# Patient Record
Sex: Male | Born: 1935 | Race: White | Hispanic: No | Smoking: Never smoker
Health system: Southern US, Community
[De-identification: ages and names within clinical notes are randomized; demographics above are authoritative.]

## PROBLEM LIST (undated history)

## (undated) DIAGNOSIS — F0390 Unspecified dementia without behavioral disturbance: Secondary | ICD-10-CM

## (undated) DIAGNOSIS — C61 Malignant neoplasm of prostate: Secondary | ICD-10-CM

## (undated) DIAGNOSIS — F32A Depression, unspecified: Secondary | ICD-10-CM

## (undated) DIAGNOSIS — E039 Hypothyroidism, unspecified: Secondary | ICD-10-CM

## (undated) DIAGNOSIS — D649 Anemia, unspecified: Secondary | ICD-10-CM

## (undated) DIAGNOSIS — G4700 Insomnia, unspecified: Secondary | ICD-10-CM

## (undated) DIAGNOSIS — I10 Essential (primary) hypertension: Secondary | ICD-10-CM

## (undated) DIAGNOSIS — K219 Gastro-esophageal reflux disease without esophagitis: Secondary | ICD-10-CM

## (undated) DIAGNOSIS — F329 Major depressive disorder, single episode, unspecified: Secondary | ICD-10-CM

## (undated) DIAGNOSIS — E871 Hypo-osmolality and hyponatremia: Secondary | ICD-10-CM

## (undated) HISTORY — DX: Hypo-osmolality and hyponatremia: E87.1

## (undated) HISTORY — DX: Depression, unspecified: F32.A

## (undated) HISTORY — DX: Major depressive disorder, single episode, unspecified: F32.9

## (undated) HISTORY — DX: Insomnia, unspecified: G47.00

## (undated) HISTORY — DX: Malignant neoplasm of prostate: C61

## (undated) HISTORY — PX: OTHER SURGICAL HISTORY: SHX169

## (undated) HISTORY — DX: Hypothyroidism, unspecified: E03.9

---

## 2000-05-30 ENCOUNTER — Ambulatory Visit (HOSPITAL_COMMUNITY): Admission: RE | Admit: 2000-05-30 | Discharge: 2000-05-30 | Payer: Self-pay | Admitting: Internal Medicine

## 2002-12-07 ENCOUNTER — Ambulatory Visit (HOSPITAL_COMMUNITY): Admission: RE | Admit: 2002-12-07 | Discharge: 2002-12-07 | Payer: Self-pay | Admitting: Gastroenterology

## 2005-01-22 ENCOUNTER — Encounter: Admission: RE | Admit: 2005-01-22 | Discharge: 2005-01-22 | Payer: Self-pay | Admitting: Vascular Surgery

## 2005-11-06 ENCOUNTER — Ambulatory Visit (HOSPITAL_COMMUNITY): Admission: RE | Admit: 2005-11-06 | Discharge: 2005-11-06 | Payer: Self-pay | Admitting: Internal Medicine

## 2006-08-12 ENCOUNTER — Encounter: Admission: RE | Admit: 2006-08-12 | Discharge: 2006-08-12 | Payer: Self-pay | Admitting: Internal Medicine

## 2007-05-18 ENCOUNTER — Encounter: Admission: RE | Admit: 2007-05-18 | Discharge: 2007-05-18 | Payer: Self-pay | Admitting: Internal Medicine

## 2007-10-15 ENCOUNTER — Encounter: Admission: RE | Admit: 2007-10-15 | Discharge: 2007-10-15 | Payer: Self-pay | Admitting: Gastroenterology

## 2008-01-01 ENCOUNTER — Other Ambulatory Visit: Payer: Self-pay

## 2008-01-01 ENCOUNTER — Emergency Department: Payer: Self-pay | Admitting: Emergency Medicine

## 2008-04-18 ENCOUNTER — Ambulatory Visit: Payer: Self-pay | Admitting: Psychology

## 2010-05-20 ENCOUNTER — Encounter: Payer: Self-pay | Admitting: Internal Medicine

## 2012-11-04 ENCOUNTER — Other Ambulatory Visit: Payer: Self-pay

## 2012-11-04 MED ORDER — CLONAZEPAM 0.5 MG PO TABS: 0.7500 mg | ORAL_TABLET | Freq: Every evening | ORAL | Status: AC | PRN

## 2013-02-01 ENCOUNTER — Ambulatory Visit: Payer: Self-pay | Admitting: Gastroenterology

## 2013-03-12 LAB — COMPREHENSIVE METABOLIC PANEL
Albumin: 4 g/dL (ref 3.4–5.0)
BUN: 21 mg/dL — ABNORMAL HIGH (ref 7–18)
Bilirubin,Total: 0.7 mg/dL (ref 0.2–1.0)
Chloride: 101 mmol/L (ref 98–107)
Co2: 27 mmol/L (ref 21–32)
Creatinine: 1 mg/dL (ref 0.60–1.30)

## 2013-03-12 LAB — URINALYSIS, COMPLETE
Bacteria: NONE SEEN
Bilirubin,UR: NEGATIVE
Hyaline Cast: 7
Leukocyte Esterase: NEGATIVE
Nitrite: NEGATIVE
Protein: NEGATIVE
RBC,UR: 1 /HPF (ref 0–5)
Specific Gravity: 1.014 (ref 1.003–1.030)
Squamous Epithelial: <1
WBC UR: 1 /HPF (ref 0–5)

## 2013-03-12 LAB — CBC
HCT: 37.7 % — ABNORMAL LOW (ref 40.0–52.0)
MCHC: 34.5 g/dL (ref 32.0–36.0)
RBC: 3.83 10*6/uL — ABNORMAL LOW (ref 4.40–5.90)

## 2013-03-12 LAB — SEDIMENTATION RATE: Erythrocyte Sed Rate: 1 mm/hr (ref 0–20)

## 2013-03-12 LAB — TSH
Thyroid Stimulating Horm: 0.456 u[IU]/mL
Thyroid Stimulating Horm: 0.346 u[IU]/mL — ABNORMAL LOW

## 2013-03-13 LAB — CBC WITH DIFFERENTIAL/PLATELET
Basophil #: 0.1 10*3/uL (ref 0.0–0.1)
Lymphocyte #: 0.8 10*3/uL — ABNORMAL LOW (ref 1.0–3.6)
MCH: 34.6 pg — ABNORMAL HIGH (ref 26.0–34.0)
MCHC: 35.1 g/dL (ref 32.0–36.0)
Monocyte #: 0.5 x10 3/mm (ref 0.2–1.0)
Neutrophil #: 3.2 10*3/uL (ref 1.4–6.5)
RBC: 3.36 10*6/uL — ABNORMAL LOW (ref 4.40–5.90)
RDW: 12.5 % (ref 11.5–14.5)

## 2013-03-13 LAB — BASIC METABOLIC PANEL
Anion Gap: 3 — ABNORMAL LOW (ref 7–16)
Co2: 27 mmol/L (ref 21–32)
EGFR (African American): >60
EGFR (Non-African Amer.): >60
Sodium: 136 mmol/L (ref 136–145)

## 2013-03-13 LAB — LIPID PANEL
Cholesterol: 108 mg/dL (ref 0–200)
HDL Cholesterol: 41 mg/dL (ref 40–60)

## 2013-03-13 LAB — MAGNESIUM: Magnesium: 1.9 mg/dL

## 2013-03-13 LAB — WBCS, STOOL

## 2013-03-15 ENCOUNTER — Inpatient Hospital Stay: Payer: Self-pay | Admitting: Internal Medicine

## 2013-03-15 LAB — STOOL CULTURE

## 2013-03-16 LAB — URINALYSIS, COMPLETE: Squamous Epithelial: NONE SEEN

## 2013-03-28 ENCOUNTER — Emergency Department: Payer: Self-pay | Admitting: Internal Medicine

## 2013-03-28 LAB — URINALYSIS, COMPLETE
Bilirubin,UR: NEGATIVE
Leukocyte Esterase: NEGATIVE
Ph: 6 (ref 4.5–8.0)
Squamous Epithelial: NONE SEEN
WBC UR: 1 /HPF (ref 0–5)

## 2013-03-28 LAB — COMPREHENSIVE METABOLIC PANEL
Bilirubin,Total: 0.6 mg/dL (ref 0.2–1.0)
Chloride: 95 mmol/L — ABNORMAL LOW (ref 98–107)
EGFR (Non-African Amer.): >60
Potassium: 4.2 mmol/L (ref 3.5–5.1)
SGPT (ALT): 24 U/L (ref 12–78)
Sodium: 131 mmol/L — ABNORMAL LOW (ref 136–145)

## 2013-03-28 LAB — CBC
HGB: 13.4 g/dL (ref 13.0–18.0)
MCH: 34 pg (ref 26.0–34.0)
MCHC: 34.6 g/dL (ref 32.0–36.0)
RDW: 12.4 % (ref 11.5–14.5)

## 2013-03-28 LAB — TSH: Thyroid Stimulating Horm: 2.29 u[IU]/mL

## 2013-03-28 LAB — TROPONIN I: Troponin-I: <0.02 ng/mL

## 2013-03-29 ENCOUNTER — Other Ambulatory Visit: Payer: Self-pay | Admitting: Internal Medicine

## 2013-03-31 ENCOUNTER — Other Ambulatory Visit: Payer: Self-pay | Admitting: Radiology

## 2013-04-06 ENCOUNTER — Ambulatory Visit: Payer: Self-pay | Admitting: Oncology

## 2013-04-06 LAB — CBC CANCER CENTER
Basophil #: 0 x10 3/mm (ref 0.0–0.1)
Eosinophil #: 0.1 x10 3/mm (ref 0.0–0.7)
Eosinophil %: 1.6 %
HGB: 12.3 g/dL — ABNORMAL LOW (ref 13.0–18.0)
MCH: 33.5 pg (ref 26.0–34.0)
MCV: 99 fL (ref 80–100)
Monocyte #: 0.6 x10 3/mm (ref 0.2–1.0)
Neutrophil #: 5.1 x10 3/mm (ref 1.4–6.5)
WBC: 7 x10 3/mm (ref 3.8–10.6)

## 2013-04-06 LAB — COMPREHENSIVE METABOLIC PANEL
Bilirubin,Total: 0.3 mg/dL (ref 0.2–1.0)
EGFR (African American): 53 — ABNORMAL LOW
EGFR (Non-African Amer.): 46 — ABNORMAL LOW
Potassium: 4.5 mmol/L (ref 3.5–5.1)
SGOT(AST): 20 U/L (ref 15–37)
Sodium: 137 mmol/L (ref 136–145)

## 2013-04-14 ENCOUNTER — Ambulatory Visit: Payer: Self-pay | Admitting: Neurology

## 2013-04-14 ENCOUNTER — Other Ambulatory Visit: Payer: Self-pay

## 2013-04-29 ENCOUNTER — Ambulatory Visit: Payer: Self-pay | Admitting: Oncology

## 2013-05-25 ENCOUNTER — Ambulatory Visit: Payer: Self-pay | Admitting: Gastroenterology

## 2013-05-27 LAB — PATHOLOGY REPORT

## 2013-07-09 ENCOUNTER — Ambulatory Visit: Payer: Self-pay | Admitting: Oncology

## 2013-09-27 ENCOUNTER — Ambulatory Visit: Payer: Self-pay | Admitting: Gastroenterology

## 2014-07-15 ENCOUNTER — Ambulatory Visit: Payer: Self-pay | Admitting: Gastroenterology

## 2014-08-19 NOTE — Consult Note (Signed)
Brief Consult Note: Diagnosis: Major depressive disorder.   Patient was seen by consultant.   Consult note dictated.   Recommend further assessment or treatment.   Comments: Clayton Ballard has a h/o depresssion. He has been of Remeron for the past 6 years. He is admitted for diarrhea, nausea and weight loss. He was seen by Dr. Franchot Mimes in consultation this weeekend. She recommended starteing low dose Zyprexa but she did not pou the order in.   The patient minimizes his symptoms today and is not interested in starting a new medication.  PLAN: 1. The patient does not meet criteria for IVC. Please discharge as appropriate.   2. I suggested he could try Remeron again for depression, anxiety, sleep and appetite. He prefers to discuss it with his PCP.  Electronic Signatures: Orson Slick (MD)  (Signed 17-Nov-14 17:13)  Authored: Brief Consult Note   Last Updated: 17-Nov-14 17:13 by Orson Slick (MD)

## 2014-08-19 NOTE — Consult Note (Signed)
PATIENT NAME:  Clayton Ballard, Clayton Ballard MR#:  [id number] DATE OF BIRTH:  [birth year]  DATE OF CONSULTATION:  03/12/2013  REFERRING PHYSICIAN:   CONSULTING PHYSICIAN:  Dionisio David, MD  INDICATION FOR CONSULTATION:  Sinus bradycardia.   HISTORY OF PRESENT ILLNESS:  This is a 80 year old white male with the past medical history of hypothyroidism, insomnia, came into the hospital with generalized weakness, dizziness, disturbance in gait for the past couple of weeks. No syncope, no chest pain. No shortness of breath. No PND, no orthopnea. He basically feels very dizzy when he walks. He has been getting physical therapy with some improvement.   PAST MEDICAL HISTORY:  1.  A history of hypothyroidism. 2.  A history of depression.  3.  A history of insomnia.   MEDICATIONS:  1.  Diazepam. 2.  Zofran 3.  Bupropion 7.5 mg once a day. He has not been taking any beta-blocker.   SOCIAL HISTORY:  Denies EtOH abuse or smoking.   FAMILY HISTORY:  Positive for coronary artery disease.   ALLERGIES: None.   PHYSICAL EXAMINATION:  GENERAL:  Heart rate is 44, the monitor shows right now 46 heart rate, respirations 18, blood pressure is 123/62. He is afebrile.  NECK:  No JVD.  LUNGS:  Clear.  HEART:  Regular rate and rhythm, bradycardiac, normal S1, S2. A 2/6 systolic murmur and diastolic murmur at the aortic area.  ABDOMEN:  Soft, nontender, positive bowel sounds.  EXTREMITIES:  No pedal edema.  NEUROLOGIC:  Appears to be intact.   LABS/STUDIES:  EKG shows sinus bradycardia, 46 beats per minute, left axis deviation, pulmonary disease, low voltage, old septal and MI. Echocardiogram shows normal LV function, moderate to severe tricuspid regurgitation, moderate to severe aortic regurgitation, dilated RA and RV and left atrium.   ASSESSMENT AND PLAN:  The patient has aortic regurgitation, severe sinus bradycardia. No positives on the monitor, the lowest heart rate was 44. We will observe the patient by giving IV  fluids and watching the patient on the monitor, may have sick sinus syndrome. If there are positives and continues to be dizzy with this sinus bradycardia, then this would be considered symptomatic sinus bradycardia and may need permanent pacemaker but right now we would like to watch this patient over the monitor in the next day or two to decide whether he is candidate for pacemaker. Thank you very much for the referral.   ____________________________ Dionisio David, MD sak:jm D: 03/12/2013 17:03:45 ET T: 03/12/2013 17:27:17 ET JOB#: [id number]  cc: Dionisio David, MD, <Dictator> Dionisio David MD ELECTRONICALLY SIGNED 04/05/2013 8:32

## 2014-08-19 NOTE — Discharge Summary (Signed)
PATIENT NAME:  Clayton Ballard, Clayton Ballard MR#:  [id number] DATE OF BIRTH:  March 05, 1935  DATE OF ADMISSION:  03/15/2013 DATE OF DISCHARGE:  03/16/2013  ADMITTING PHYSICIAN: Shreyang H. Posey Pronto, MD  DISCHARGING PHYSICIAN: Gladstone Lighter, MD   PRIMARY CARE PHYSICIAN: Richard L. Rosanna Randy, Britt:  1.  Cardiology consultation with Dionisio David, MD. 2.  Psych consultation by Wardell Honour. Bary Leriche, MD.  DISCHARGE DIAGNOSES: 1.  Asymptomatic sinus bradycardia, heart rate in the 50 range.  2.  Severe aortic regurgitation.  3.  Hypothyroidism.  4.  Hypertension.  5.  Depression and anxiety.  6.  Benign prostatic hypertrophy.  7.  Incontinence.  8.  Fatigue.  9.  Dementia with cognitive impairment.   DISCHARGE MEDICATIONS: 1.  Zofran 8 mg p.o. b.i.d. p.r.n. for nausea and vomiting.  2.  Buspirone 7.5 mg p.o. daily.  3.  VESIcare 5 mg p.o. daily.  4.  Finasteride 5 mg p.o. daily.  5.  Diazepam 2 mg p.o. daily.  6.  Norvasc 2.5 mg p.o. daily.   DISCHARGE DIET: Low-sodium diet.   DISCHARGE ACTIVITY: As tolerated.    FOLLOWUP INSTRUCTIONS: 1.  Home health physical therapy and nursing.  2.  PCP followup in 1 to 2 weeks.  3.  Follow up with Dr. Neoma Laming in 1 week. Scheduled his followup for 03/22/2013 at 10:00 a.m. with Dr. Humphrey Rolls.   LABORATORY AND IMAGING STUDIES PRIOR TO DISCHARGE:  1.  Urinalysis negative for any infection.  2.  WBC 4.7, hemoglobin 11.6, hematocrit 33.1, platelet count is 130.  3.  Sodium 136, potassium 4.3, chloride 106, bicarb 27, BUN 12, creatinine 0.99, glucose 79, calcium of 8.3, magnesium 1.9.  4.  Stool cultures are negative.  5.  LDL cholesterol 55, HDL 41, total cholesterol 108, triglycerides 59.  6.  CT of the abdomen and pelvis done on admission showing no acute intraabdominal or pelvic pathology, moderate amount of stool within colon noted, horseshoe kidney with nonobstructing right renal calculus 6 mm noted.  7.  Echo Doppler  showing LV ejection fraction is 70% to 75%, impaired relaxation of LV diastolic filling and severely enlarged right ventricle and moderate to severe aortic regurgitation is seen.   8.  Serum testosterone level is within normal limits.  9.  Vitamin D level is 86.9.  10.  Serum cortisol is within normal limits at 18.2.  11.  Free thyroxine is elevated at 1.6.   BRIEF HOSPITAL COURSE: Clayton Ballard a 80 year old elderly Caucasian male with past medical history significant for dementia with cognitive impairment, hypertension, hypothyroidism and depression with anxiety who lives at home with his wife, was brought in secondary to weakness, nausea and diarrhea.  1.  Generalized weakness and fatigue when he came in, likely secondary to dehydration as the patient was having gastroenteritis which could be likely viral.  His CT of the abdomen was completely normal. He was started on IV fluids. Blood pressure was initially low, that improved with fluids. The patient had a previous colonoscopy according to family which was normal at the time. However, his GI symptoms resolved and his weakness and fatigue have also been improved.  2.  Sinus bradycardia. He was noted to be sinus bradycardic, heart rate as low as in the 40s while in the hospital. The patient is known to have sinus bradycardia. He was monitored on telemetry for a couple of days to make sure there were no other blocks and heart rate did not go down further.  His initial symptoms of fatigue and weakness were likely secondary to diarrhea and not secondary to his bradycardia. Once his GI symptoms improved, the patient does not have any further fatigue or weakness and was back to baseline. He was seen by Dr. Humphrey Rolls from cardiology standpoint and will follow up with cardiology as an outpatient in 1 week to see if he needs an event monitor. No indication for pacemaker at this time. Beta blockers and calcium channel blockers are being avoided at this time.  3.   Hypertension. Orthostatic blood pressure changes were measured and the patient did not have any hypotension and he is being started on low-dose amlodipine. Amlodipine will not cause any bradycardia.  4.  Depression and anxiety, seen by psych while in the hospital. He is on bupropion and also diazepam. Psych has recommended Remeron but the patient and the family have refused at this time and wants to follow up with the PCP prior to being started on that medication.   His course has been otherwise uneventful in the hospital. He was seen by physical therapy, who recommended home health prior to discharge.   DISCHARGE CONDITION: Stable.   DISCHARGE DISPOSITION: Home with home health.   TIME SPENT ON DISCHARGE: 45 minutes.   ____________________________ Gladstone Lighter, MD rk:cs D: 03/16/2013 14:16:00 ET T: 03/16/2013 15:26:19 ET JOB#: [id number]  cc: Gladstone Lighter, MD, <Dictator> Richard L. Rosanna Randy, MD Dionisio David, MD Gladstone Lighter MD ELECTRONICALLY SIGNED 03/23/2013 18:16

## 2014-08-19 NOTE — H&P (Signed)
PATIENT NAME:  Clayton Ballard, Clayton Ballard MR#:  [id number] DATE OF BIRTH:  [birth year]  DATE OF ADMISSION:  03/12/2013  PRIMARY CARE PROVIDER: Dr. Ayesha Rumpf REFERRING PHYSICIAN:  Dr. Corky Downs  CHIEF COMPLAINT: Generalized weakness, nausea, diarrhea, weight loss.   HISTORY OF PRESENT ILLNESS: The patient is a 80 year old white male with history of hypothyroidism and insomnia who also has a history of depression who states that he has not been feeling well for the past few weeks. The patient has had progressive weakness and generalized weakness. He also has been feeling very dizzy and has fallen multiple times. He also states that he almost felt like he was going to pass out. The patient also reports that over the past few weeks he has been having diarrhea which he describes as semisolid in nature associated with nausea. He has not noticed any blood in the stools. He has also no vomiting but has had nausea. He does not have any abdominal pain. He reports that he has had a colonoscopy within the past few years here, but I do not see any results of that. He also has been trying to eat much as possible, but the nausea limits his eating. He denies any chest pains. No shortness of breath. No palpitations. Denies any urinary frequency, urgency or hesitancy.   PAST MEDICAL HISTORY: He is listed as having hypothyroidism, but the patient not on any supplements. He is not sure if he has hypothyroidism. Significant for insomnia, depression and having low heart rate in the past, but reports that this is much lower than normal.   PAST SURGICAL HISTORY: None.   ALLERGIES: No known drug allergies.   CURRENT MEDICATIONS: Bupropion 7.5 mg p.o. daily, diazepam 2 mg daily, finasteride 5 mg daily, Zofran 8 mg 1 tab p.o. b.i.d. as needed and VESIcare 5 mg daily.   SOCIAL HISTORY: Does not smoke. Does not drink. No drugs.   FAMILY HISTORY: He states that he is unable to recall.   REVIEW OF SYSTEMS:  CONSTITUTIONAL: Complains of  fatigue and weakness. No pain. Complains of weight loss, he says 5 to 10 pounds within the past few months.  EYES: No blurred or double vision. No pain. No redness. No inflammation. No glaucoma. No cataracts.  ENT: No tinnitus. No ear pain. No hearing loss. No seasonal or year-round allergies. No epistaxis. No discharge. No snoring.  RESPIRATORY: Denies any cough, wheezing, hemoptysis. No COPD. No TB  CARDIOVASCULAR: Denies any chest pain, orthopnea, edema or arrhythmia. No palpitations.  GASTROINTESTINAL: Complains of nausea, but no vomiting. Complains of diarrhea. No abdominal pain. No hematemesis. No melena. No GERD. No IBS. No jaundice.  GENITOURINARY: Denies any dysuria, hematuria, renal calculus or frequency.  ENDOCRINE: Denies any polyuria, nocturia or thyroid problems.  HEMATOLOGIC AND LYMPHATIC: Denies any major bruisability or bleeding.  SKIN: No acne. No rash. No changes in mole, hair or skin.  MUSCULOSKELETAL: Denies any pain in the neck, back or shoulder.  NEUROLOGIC: No numbness. No CVA. No TIA. No seizures.  PSYCHIATRIC: Has depression.   PHYSICAL EXAMINATION: VITAL SIGNS: Temperature 97.6, pulse 47, respirations 18, blood pressure 141/53, O2 99%.  GENERAL: The patient is a very frail-looking male, thin, in no acute distress.  HEENT: Head atraumatic, normocephalic. Pupils equally round and reactive to light and accommodation. There is no conjunctival pallor. No scleral icterus. Extraocular movements intact. Nasal exam shows no nasal lesions or drainage. Ears: There is no drainage or external lesions. Mouth: Lips are dry but mouth is moist.  NECK:  Supple and symmetric. No masses. Thyroid midline.  LUNGS: Good respiratory effort. Clear to auscultation without any rales, rhonchi or wheezing.  HEART: Regular rate and rhythm. No murmurs, rubs, clicks or gallops. PMI is not displaced.  ABDOMEN: Soft, nontender, nondistended. Positive bowel sounds x4. There is no guarding. No rebound.   EXTREMITIES: No clubbing, cyanosis or edema.  SKIN: No rash.  LYMPHATICS: No lymph nodes palpable.  VASCULAR: Good DP and PT pulses.  PSYCHIATRIC: Not anxious or depressed.  NEUROLOGIC: Awake, alert and oriented x3. No focal deficits.  PSYCHIATRIC: Not anxious or depressed.   LABORATORY AND DIAGNOSTICS: Evaluations in the ED: Glucose 106, BUN 21, creatinine 1, sodium 133, potassium 4.4, chloride 101, CO2 27, calcium 8.9. LFTs: Total protein 6.1, albumin is 4. Troponin less than 0.02. TSH 0.456. WBC 4.3, hemoglobin 13 and platelet count is 135. Urinalysis: Nitrites negative, leukocytes negative.   CT scan of the head without contrast shows no acute intracranial abnormality. Chest x-ray shows no acute cardiopulmonary processes.   EKG shows sinus bradycardia.   ASSESSMENT AND PLAN: The patient is a 80 year old white male with history of possible hypothyroidism, not on any treatment, insomnia and depression who presents with weakness, nausea and diarrhea, also weight loss.  1.  Weakness, possibly related to bradycardia and dehydration. We will give him IV fluids. I will have PT evaluate the patient since he has had falls.  2.  Bradycardia, history of low heart rate, but not this low, according to the patient. Possible sick sinus syndrome. Will have cardiology evaluate the patient. Echocardiogram of the heart. 3.  Nausea, diarrhea and weight loss. We will get a CT of the abdomen, check an ESR and stool studies.  4.  Hypothyroidism. Not on any treatment. Check TSH.  5.  Will use Lovenox for deep vein thrombosis prophylaxis.  TIME SPENT: 45 minutes. ____________________________ Lafonda Mosses Posey Pronto, MD shp:sb D: 03/12/2013 11:08:07 ET T: 03/12/2013 11:15:28 ET JOB#: [id number]  cc: Elyse Prevo H. Posey Pronto, MD, <Dictator> Alric Seton MD ELECTRONICALLY SIGNED 03/12/2013 16:59

## 2014-08-19 NOTE — Consult Note (Signed)
PATIENT NAME:  Clayton Ballard, Clayton Ballard MR#:  [id number] DATE OF BIRTH:  04-10-1935  DATE OF CONSULTATION:  03/14/2013  REFERRING PHYSICIAN:   CONSULTING PHYSICIAN:  Saramarie Stinger K. Katniss Weedman, MD  SUBJECTIVE:  The patient was seen in consultation, room #258.  The patient is a 80 year old male who is retired after working for [employer] for [employer] which is a [employer].  The patient is married for 31 years for the second time and lives with his wife who is 3 years old and relates well with her.  The patient comes to [facility] with a chief complaint, "nausea and losing weight."    HISTORY OF PRESENT ILLNESS:  The patient reports that he has been nauseated and he has been losing weight and lost about 8 pounds in a period of 3 years.    PAST PSYCHIATRIC HISTORY:  No previous history of inpatient psychiatry.  No history of suicide attempt.  Not being followed by any psychiatrist and never been treated for depression.    ALCOHOL AND DRUGS:  Denied.    MENTAL STATUS EXAMINATION:  The patient is alert and oriented to place, person and time except that he said [place] was the capital of [place] which was wrong.  He knew the capital of the [place] as [place], [place], and knew the name of the current president.  Cognition is intact.  Affect appears to be bright and cheerful.  He was cooperative during the interview.  He absolutely denied feeling depressed, denied feeling hopeless or helpless, denied feeling worthless or useless.  No psychosis.  Denied auditory or visual hallucinations.  Denies any paranoid or suspicious ideas.  Cognition is intact.  General knowledge and information is fair.  Denies any ideas or plans to hurt himself or others.  Insight and judgment fair and adequate.    IMPRESSION:  Mood disorder not otherwise specified versus anxiety state not otherwise specified.    RECOMMEND:  Start the patient on Zyprexa 2.5 mg which will help him rest better, feel better and help him with his  mood and will improve his appetite.    Thanks for the consult.    ____________________________ Wallace Cullens. Franchot Mimes, MD skc:cs D: 03/14/2013 18:06:31 ET T: 03/14/2013 18:41:03 ET JOB#: [id number]  cc: Arlyn Leak K. Franchot Mimes, MD, <Dictator> Dewain Penning MD ELECTRONICALLY SIGNED 03/20/2013 8:58

## 2014-11-24 ENCOUNTER — Ambulatory Visit: Payer: Self-pay | Admitting: Podiatry

## 2014-12-13 ENCOUNTER — Ambulatory Visit: Payer: Self-pay | Admitting: Podiatry

## 2014-12-27 ENCOUNTER — Ambulatory Visit: Payer: Self-pay | Admitting: Podiatry

## 2015-01-05 ENCOUNTER — Encounter: Payer: Self-pay | Admitting: Podiatry

## 2015-01-05 ENCOUNTER — Ambulatory Visit (INDEPENDENT_AMBULATORY_CARE_PROVIDER_SITE_OTHER): Payer: Self-pay | Admitting: Podiatry

## 2015-01-05 VITALS — BP 137/78 | HR 61 | Resp 18

## 2015-01-05 DIAGNOSIS — M2010 Hallux valgus (acquired), unspecified foot: Secondary | ICD-10-CM

## 2015-01-05 DIAGNOSIS — L84 Corns and callosities: Secondary | ICD-10-CM

## 2015-01-05 DIAGNOSIS — M79676 Pain in unspecified toe(s): Secondary | ICD-10-CM

## 2015-01-05 DIAGNOSIS — B351 Tinea unguium: Secondary | ICD-10-CM

## 2015-01-05 NOTE — Progress Notes (Signed)
   Subjective:    Patient ID: Clayton Ballard, male    DOB: Jun 08, 1934, 80 y.o.   MRN: [id number]  HPI 80 year old male presents the office they with his family for consent of left big toe nail thickness, discoloration which is painful particularly in shoe gear. He also states assistant nails are also thick and discolored and difficult to trim them because pain protective pressure in certain shoes. He has been applying Fungi-Nail to his nails.He denies any redness or drainage from the nail site denies any pus. He has a corn between his biggest second toe on the right foot he does wear a sleep over the area to help protect it which helps a limited pain. No other complaints at this time. Denies any recent injury or trauma.  Review of Systems  All other systems reviewed and are negative.      Objective:   Physical Exam AAO X3, NAD DP/PT pulses palpable, CRT less than 3 seconds  protective sensation appears to be intact with Derrel Nip monofilament, vibratory sensation intact, Achilles tendon reflex intact Nails hypertrophic, dystrophic, brittle, discolored, elongated x10. Particular left hallux toenail significantly hypertrophic and dystrophic. There is tenderness to palpation of the nails 1-5 bilaterally. There is no surrounding erythema, edema, ascending cellulitis, fluctuance, crepitus, drainage/purulence. There is a hyperkeratotic lesion along the course aspect of the hallux and second digit of the right foot. Upon debridement there was no underlying ulceration, drainage or other signs of infection. HAV is present bilaterally. No other areas of tenderness to bilateral lower extremities. There is no overlying edema, erythema, increase in warmth. No pain with calf compression, swelling, warmth, erythema.     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 -Treatment options discussed including all alternatives, risks, and  complications -Discussed nail removal of the left hallux toenail versus debridement. Able to proceed with nail debridement. There is currently no signs of infection. The nails are sharply debrided E52 without complication/bleeding. -Hyperkeratotic lesions sharply debrided without complication/bleeding. Dispensed offloading pads. -Discussed the importance of daily foot inspection. -Follow-up in 3 months or sooner if any problems arise. In the meantime, encouraged to call the office with any questions, concerns, change in symptoms.   Celesta Gentile, DPM

## 2015-04-06 ENCOUNTER — Ambulatory Visit: Payer: Self-pay | Admitting: Podiatry

## 2015-04-20 ENCOUNTER — Ambulatory Visit: Payer: Medicare Other | Admitting: Podiatry

## 2015-05-04 ENCOUNTER — Ambulatory Visit (INDEPENDENT_AMBULATORY_CARE_PROVIDER_SITE_OTHER): Payer: Medicare Other | Admitting: Podiatry

## 2015-05-04 ENCOUNTER — Encounter: Payer: Self-pay | Admitting: Podiatry

## 2015-05-04 DIAGNOSIS — M79676 Pain in unspecified toe(s): Secondary | ICD-10-CM | POA: Diagnosis not present

## 2015-05-04 DIAGNOSIS — B351 Tinea unguium: Secondary | ICD-10-CM | POA: Diagnosis not present

## 2015-05-04 NOTE — Progress Notes (Signed)
Patient ID: Clayton Ballard, male   DOB: 01/05/1935, 81 y.o.   MRN: [id number]  Subjective: 81 y.o. returns the office today for painful, elongated, thickened toenails which me cannot trim himself. Denies any redness or drainage around the nails. Denies any acute changes since last appointment and no new complaints today. Denies any systemic complaints such as fevers, chills, nausea, vomiting.   Objective: AAO 3, NAD DP/PT pulses palpable, CRT less than 3 seconds Nails hypertrophic, dystrophic, elongated, brittle, discolored 10. There is tenderness overlying the nails 1-5 bilaterally. There is no surrounding erythema or drainage along the nail sites. No open lesions or pre-ulcerative lesions are identified. No other areas of tenderness bilateral lower extremities. No overlying edema, erythema, increased warmth. No pain with calf compression, swelling, warmth, erythema.  Assessment: Patient presents with symptomatic onychomycosis  Plan: -Treatment options including alternatives, risks, complications were discussed -Nails sharply debrided 10 without complication/bleeding. -Discussed daily foot inspection. If there are any changes, to call the office immediately.  -Follow-up in 3 months or sooner if any problems are to arise. In the meantime, encouraged to call the office with any questions, concerns, changes symptoms.  Celesta Gentile, DPM

## 2015-08-03 ENCOUNTER — Ambulatory Visit: Payer: Medicare Other | Admitting: Podiatry

## 2015-08-24 ENCOUNTER — Ambulatory Visit: Payer: Medicare Other | Admitting: Podiatry

## 2015-09-14 ENCOUNTER — Ambulatory Visit: Payer: Medicare Other | Admitting: Podiatry

## 2015-09-26 ENCOUNTER — Ambulatory Visit: Payer: Medicare Other | Admitting: Podiatry

## 2015-10-03 ENCOUNTER — Ambulatory Visit: Payer: Medicare Other

## 2015-10-19 ENCOUNTER — Ambulatory Visit (INDEPENDENT_AMBULATORY_CARE_PROVIDER_SITE_OTHER): Payer: Medicare Other | Admitting: Podiatry

## 2015-10-19 ENCOUNTER — Encounter: Payer: Self-pay | Admitting: Podiatry

## 2015-10-19 DIAGNOSIS — M79676 Pain in unspecified toe(s): Secondary | ICD-10-CM | POA: Diagnosis not present

## 2015-10-19 DIAGNOSIS — L84 Corns and callosities: Secondary | ICD-10-CM

## 2015-10-19 DIAGNOSIS — B351 Tinea unguium: Secondary | ICD-10-CM

## 2015-10-19 NOTE — Progress Notes (Signed)
Patient ID: Clayton Ballard, male   DOB: 1934-08-07, 81 y.o.   MRN: [id number]  Subjective: 81 y.o. returns the office today for painful, elongated, thickened toenails which me cannot trim himself. Denies any redness or drainage around the nails. Denies any acute changes since last appointment and no new complaints today. Denies any systemic complaints such as fevers, chills, nausea, vomiting.   Objective: AAO 3, NAD DP/PT pulses palpable, CRT less than 3 seconds Nails hypertrophic, dystrophic, elongated, brittle, discolored 10. There is tenderness overlying the nails 1-5 bilaterally. There is no surrounding erythema or drainage along the nail sites. Hyperkerotic lesion on the right sub-1. No underlying ulcer or signs of infection No open lesions or pre-ulcerative lesions are identified. No other areas of tenderness bilateral lower extremities. No overlying edema, erythema, increased warmth. No pain with calf compression, swelling, warmth, erythema.  Assessment: Patient presents with symptomatic onychomycosis; hypkerkerotic lesion  Plan: -Treatment options including alternatives, risks, complications were discussed -Nails sharply debrided 10 without complication/bleeding. -Hypkerkerotic lesion debrided x 1 without complications or bleeding.  -Discussed daily foot inspection. If there are any changes, to call the office immediately.  -Follow-up in 3 months or sooner if any problems are to arise. In the meantime, encouraged to call the office with any questions, concerns, changes symptoms.  Celesta Gentile, DPM

## 2016-01-23 ENCOUNTER — Ambulatory Visit: Payer: Medicare Other | Admitting: Podiatry

## 2016-09-17 ENCOUNTER — Emergency Department (HOSPITAL_COMMUNITY)
Admission: EM | Admit: 2016-09-17 | Discharge: 2016-09-17 | Disposition: A | Discharge status: Home / Self Care | Payer: Medicare Other | Attending: Physician Assistant | Admitting: Physician Assistant

## 2016-09-17 ENCOUNTER — Encounter (HOSPITAL_COMMUNITY): Payer: Self-pay | Admitting: Family Medicine

## 2016-09-17 ENCOUNTER — Emergency Department (HOSPITAL_COMMUNITY): Payer: Medicare Other

## 2016-09-17 DIAGNOSIS — M25551 Pain in right hip: Secondary | ICD-10-CM | POA: Insufficient documentation

## 2016-09-17 DIAGNOSIS — Z79899 Other long term (current) drug therapy: Secondary | ICD-10-CM | POA: Insufficient documentation

## 2016-09-17 DIAGNOSIS — I10 Essential (primary) hypertension: Secondary | ICD-10-CM | POA: Insufficient documentation

## 2016-09-17 DIAGNOSIS — Y929 Unspecified place or not applicable: Secondary | ICD-10-CM | POA: Diagnosis not present

## 2016-09-17 DIAGNOSIS — E039 Hypothyroidism, unspecified: Secondary | ICD-10-CM | POA: Insufficient documentation

## 2016-09-17 DIAGNOSIS — Y939 Activity, unspecified: Secondary | ICD-10-CM | POA: Diagnosis not present

## 2016-09-17 DIAGNOSIS — Y999 Unspecified external cause status: Secondary | ICD-10-CM | POA: Diagnosis not present

## 2016-09-17 DIAGNOSIS — S79911A Unspecified injury of right hip, initial encounter: Secondary | ICD-10-CM | POA: Diagnosis present

## 2016-09-17 DIAGNOSIS — W19XXXA Unspecified fall, initial encounter: Secondary | ICD-10-CM | POA: Insufficient documentation

## 2016-09-17 DIAGNOSIS — Z8546 Personal history of malignant neoplasm of prostate: Secondary | ICD-10-CM | POA: Diagnosis not present

## 2016-09-17 DIAGNOSIS — F0390 Unspecified dementia without behavioral disturbance: Secondary | ICD-10-CM | POA: Insufficient documentation

## 2016-09-17 HISTORY — DX: Gastro-esophageal reflux disease without esophagitis: K21.9

## 2016-09-17 HISTORY — DX: Anemia, unspecified: D64.9

## 2016-09-17 HISTORY — DX: Unspecified dementia, unspecified severity, without behavioral disturbance, psychotic disturbance, mood disturbance, and anxiety: F03.90

## 2016-09-17 HISTORY — DX: Essential (primary) hypertension: I10

## 2016-09-17 LAB — CBC WITH DIFFERENTIAL/PLATELET
BASOS ABS: 0.1 10*3/uL (ref 0.0–0.1)
Basophils Relative: 1 %
EOS ABS: 0.1 10*3/uL (ref 0.0–0.7)
EOS PCT: 2 %
HCT: 32.3 % — ABNORMAL LOW (ref 39.0–52.0)
Hemoglobin: 10.7 g/dL — ABNORMAL LOW (ref 13.0–17.0)
LYMPHS PCT: 29 %
Lymphs Abs: 2 10*3/uL (ref 0.7–4.0)
MCH: 32.1 pg (ref 26.0–34.0)
MCHC: 33.1 g/dL (ref 30.0–36.0)
MCV: 97 fL (ref 78.0–100.0)
Monocytes Absolute: 0.5 10*3/uL (ref 0.1–1.0)
Monocytes Relative: 8 %
Neutro Abs: 4 10*3/uL (ref 1.7–7.7)
Neutrophils Relative %: 60 %
PLATELETS: 173 10*3/uL (ref 150–400)
RBC: 3.33 MIL/uL — AB (ref 4.22–5.81)
RDW: 12.4 % (ref 11.5–15.5)
WBC: 6.7 10*3/uL (ref 4.0–10.5)

## 2016-09-17 LAB — URINALYSIS, ROUTINE W REFLEX MICROSCOPIC
Bilirubin Urine: NEGATIVE
Glucose, UA: NEGATIVE mg/dL
Hgb urine dipstick: NEGATIVE
Ketones, ur: NEGATIVE mg/dL
LEUKOCYTES UA: NEGATIVE
NITRITE: NEGATIVE
PROTEIN: NEGATIVE mg/dL
Specific Gravity, Urine: 1.012 (ref 1.005–1.030)
pH: 5 (ref 5.0–8.0)

## 2016-09-17 LAB — COMPREHENSIVE METABOLIC PANEL
ALT: 17 U/L (ref 17–63)
AST: 24 U/L (ref 15–41)
Albumin: 4.2 g/dL (ref 3.5–5.0)
Alkaline Phosphatase: 69 U/L (ref 38–126)
Anion gap: 6 (ref 5–15)
BILIRUBIN TOTAL: 0.2 mg/dL — AB (ref 0.3–1.2)
BUN: 30 mg/dL — AB (ref 6–20)
CO2: 26 mmol/L (ref 22–32)
Calcium: 8.7 mg/dL — ABNORMAL LOW (ref 8.9–10.3)
Chloride: 106 mmol/L (ref 101–111)
Creatinine, Ser: 1.88 mg/dL — ABNORMAL HIGH (ref 0.61–1.24)
GFR calc Af Amer: 38 mL/min — ABNORMAL LOW (ref >60)
GFR, EST NON AFRICAN AMERICAN: 32 mL/min — AB (ref >60)
Glucose, Bld: 94 mg/dL (ref 65–99)
POTASSIUM: 4.6 mmol/L (ref 3.5–5.1)
Sodium: 138 mmol/L (ref 135–145)
TOTAL PROTEIN: 6.3 g/dL — AB (ref 6.5–8.1)

## 2016-09-17 NOTE — ED Notes (Signed)
Please call patients daughter with information, Serita Sheller, [phone].

## 2016-09-17 NOTE — ED Notes (Signed)
Patients daughter is coming to pick patient up and does not want patient transported via [place].

## 2016-09-17 NOTE — ED Notes (Signed)
Spoke with main lab, it will be about 10 minutes before the CMP has resulted.

## 2016-09-17 NOTE — ED Provider Notes (Signed)
[place] DEPT Provider Note   CSN: [id number] Arrival date & time: 09/17/16  1505     History   Chief Complaint Chief Complaint  Patient presents with  . Fall    HPI [facility] Clayton Ballard is a 82 y.o. male.  HPI   Patient is a 82 year old male presenting after unwitnessed fall. Patient's coming by daughter. Patient lives in a assisted facility. Patient has history of hyponatremia, dementia. Daughter reports she's been feeling a little weaker than usual. Patient got no physical signs of trauma. Was complaining of right hip pain with EMS similar complaint of right hip pain.  Past Medical History:  Diagnosis Date  . Anemia   . Dementia   . Depression   . GERD (gastroesophageal reflux disease)   . Hypertension   . Hyponatremia   . Hypothyroidism   . Insomnia   . Prostate cancer ([place])     There are no active problems to display for this patient.   Past Surgical History:  Procedure Laterality Date  . SEED IMPLANTS     PROSTATE CANCER       Home Medications    Prior to Admission medications   Medication Sig Start Date End Date Taking? Authorizing Provider  acetaminophen (TYLENOL) 325 MG tablet Take 650 mg by mouth. 05/16/13  Yes [provider]  busPIRone (BUSPAR) 7.5 MG tablet Take 7.5 mg by mouth 3 (three) times daily.  03/11/13  Yes [provider]  calcium carbonate (HEALTHY MAMA TAME THE FLAME) 500 MG chewable tablet Chew 1,000 mg by mouth. 05/16/13  Yes [provider]  citalopram (CELEXA) 20 MG tablet Take 20 mg by mouth daily.  03/05/13  Yes [provider]  clonazePAM (KLONOPIN) 0.5 MG tablet Take 1.5 tablets (0.75 mg total) by mouth at bedtime as needed. 11/04/12  Yes Dohmeier, Asencion Partridge, MD  donepezil (ARICEPT) 5 MG tablet Take 5 mg by mouth at bedtime.  03/11/13  Yes [provider]  finasteride (PROSCAR) 5 MG tablet Take 5 mg by mouth daily.  03/28/13  Yes [provider]  levothyroxine (SYNTHROID,  LEVOTHROID) 112 MCG tablet Take 112 mcg by mouth daily before breakfast.  03/18/13  Yes [provider]  Melatonin 3 MG TABS Take 3 mg by mouth. 05/16/13  Yes [provider]  meloxicam (MOBIC) 15 MG tablet Take 15 mg by mouth daily.  03/30/13  Yes [provider]  mirtazapine (REMERON) 30 MG tablet Take 30 mg by mouth at bedtime.  12/12/14  Yes [provider]  ondansetron (ZOFRAN) 4 MG tablet Take 4 mg by mouth every 8 (eight) hours as needed for vomiting.  09/29/14  Yes [provider]  pantoprazole (PROTONIX) 40 MG tablet Take 40 mg by mouth. 05/16/13  Yes [provider]  polyethylene glycol (MIRALAX / GLYCOLAX) packet Take 17 g by mouth daily.  05/16/13  Yes [provider]  QUEtiapine (SEROQUEL) 25 MG tablet Take 25 mg by mouth 3 (three) times daily.  04/09/13  Yes [provider]  senna (SENOKOT) 8.6 MG tablet Take 1 tablet by mouth daily.  05/16/13  Yes [provider]  traZODone (DESYREL) 50 MG tablet Take 50 mg by mouth. 05/16/13  Yes [provider]  VESICARE 5 MG tablet Take 5 mg by mouth daily.  03/28/13  Yes [provider]    Family History History reviewed. No pertinent family history.  Social History Social History  Substance Use Topics  . Smoking status: Never Smoker  .  Smokeless tobacco: Never Used  . Alcohol use No     Allergies   Trazodone and nefazodone   Review of Systems Review of Systems  Constitutional: Negative for activity change.  Respiratory: Negative for shortness of breath.   Cardiovascular: Negative for chest pain.  Gastrointestinal: Negative for abdominal pain.  All other systems reviewed and are negative.    Physical Exam Updated Vital Signs BP (!) 149/78 (BP Location: Right Arm)   Pulse (!) 56   Temp 97.5 F (36.4 C) (Oral)   Resp 20   SpO2 100%   Physical Exam  Constitutional: He appears well-nourished.  Frail elderly male.  HENT:  Head:  Normocephalic.  Eyes: Conjunctivae are normal.  Cardiovascular: Normal rate, regular rhythm and normal heart sounds.   Pulmonary/Chest: Effort normal and breath sounds normal. No respiratory distress.  Musculoskeletal: Normal range of motion. He exhibits no edema or deformity.  Bilateral legs with good range of motion at each joint. Same with bilateral upper extremities. No evidence of external trauma on limbs or trunk.  Neurological: No cranial nerve deficit.  Patient alert to person.  Skin: Skin is warm and dry. He is not diaphoretic.  No ecchymosis skin tears or abrasions.  Psychiatric: He has a normal mood and affect. His behavior is normal.     ED Treatments / Results  Labs (all labs ordered are listed, but only abnormal results are displayed) Labs Reviewed  COMPREHENSIVE METABOLIC PANEL - Abnormal; Notable for the following:       Result Value   BUN 30 (*)    Creatinine, Ser 1.88 (*)    Calcium 8.7 (*)    Total Protein 6.3 (*)    Total Bilirubin 0.2 (*)    GFR calc non Af Amer 32 (*)    GFR calc Af Amer 38 (*)    All other components within normal limits  CBC WITH DIFFERENTIAL/PLATELET - Abnormal; Notable for the following:    RBC 3.33 (*)    Hemoglobin 10.7 (*)    HCT 32.3 (*)    All other components within normal limits  URINALYSIS, ROUTINE W REFLEX MICROSCOPIC - Abnormal; Notable for the following:    Color, Urine STRAW (*)    All other components within normal limits    EKG  EKG Interpretation  Date/Time:  Tuesday Sep 17 2016 16:57:28 EDT Ventricular Rate:  54 PR Interval:    QRS Duration: 95 QT Interval:  470 QTC Calculation: 446 R Axis:   -62 Text Interpretation:  Sinus rhythm Left anterior fascicular block Anteroseptal infarct, age indeterminate No significant change since last tracing Confirmed by Zenovia Jarred [phone]) on 09/17/2016 5:00:33 PM       Radiology Ct Head Wo Contrast  Result Date: 09/17/2016 CLINICAL DATA:  Unwitnessed fall with  dementia EXAM: CT HEAD WITHOUT CONTRAST CT CERVICAL SPINE WITHOUT CONTRAST TECHNIQUE: Multidetector CT imaging of the head and cervical spine was performed following the standard protocol without intravenous contrast. Multiplanar CT image reconstructions of the cervical spine were also generated. COMPARISON:  03/12/2013, MRI 01/01/2008, CT chest 07/15/2014 FINDINGS: CT HEAD FINDINGS Brain: No acute territorial infarction, hemorrhage or intracranial mass. Moderate atrophy. Mild periventricular and subcortical white matter small vessel ischemic changes, slightly progressed. Mildly enlarged ventricles felt secondary to atrophy. Vascular: No hyperdense vessels. Carotid artery calcifications and vertebral artery calcifications. Skull: No fracture or suspicious bone lesion Sinuses/Orbits: Minimal mucosal thickening in the ethmoid sinuses. No acute orbital abnormality. Other: None CT CERVICAL SPINE FINDINGS Alignment: Trace anterolisthesis  of C7 on T1. Facet alignment within normal limits. Skull base and vertebrae: Craniovertebral junction appears intact. No definitive fracture is visualized. Soft tissues and spinal canal: No prevertebral fluid or swelling. No visible canal hematoma. Disc levels: Multilevel degenerative disc changes, moderate severe at C5-C6 and C6-C7. Multilevel facet arthropathy. Multilevel foraminal stenosis. Upper chest: Stable scarring in the right apex. Other: None IMPRESSION: 1. No CT evidence for acute intracranial abnormality. Progression of atrophy a white matter small vessel ischemic changes. 2. Multilevel degenerative disc changes of the cervical spine. No acute fracture visualized. Electronically Signed   By: Donavan Foil M.D.   On: 09/17/2016 16:57   Ct Cervical Spine Wo Contrast  Result Date: 09/17/2016 CLINICAL DATA:  Unwitnessed fall with dementia EXAM: CT HEAD WITHOUT CONTRAST CT CERVICAL SPINE WITHOUT CONTRAST TECHNIQUE: Multidetector CT imaging of the head and cervical spine was  performed following the standard protocol without intravenous contrast. Multiplanar CT image reconstructions of the cervical spine were also generated. COMPARISON:  03/12/2013, MRI 01/01/2008, CT chest 07/15/2014 FINDINGS: CT HEAD FINDINGS Brain: No acute territorial infarction, hemorrhage or intracranial mass. Moderate atrophy. Mild periventricular and subcortical white matter small vessel ischemic changes, slightly progressed. Mildly enlarged ventricles felt secondary to atrophy. Vascular: No hyperdense vessels. Carotid artery calcifications and vertebral artery calcifications. Skull: No fracture or suspicious bone lesion Sinuses/Orbits: Minimal mucosal thickening in the ethmoid sinuses. No acute orbital abnormality. Other: None CT CERVICAL SPINE FINDINGS Alignment: Trace anterolisthesis of C7 on T1. Facet alignment within normal limits. Skull base and vertebrae: Craniovertebral junction appears intact. No definitive fracture is visualized. Soft tissues and spinal canal: No prevertebral fluid or swelling. No visible canal hematoma. Disc levels: Multilevel degenerative disc changes, moderate severe at C5-C6 and C6-C7. Multilevel facet arthropathy. Multilevel foraminal stenosis. Upper chest: Stable scarring in the right apex. Other: None IMPRESSION: 1. No CT evidence for acute intracranial abnormality. Progression of atrophy a white matter small vessel ischemic changes. 2. Multilevel degenerative disc changes of the cervical spine. No acute fracture visualized. Electronically Signed   By: Donavan Foil M.D.   On: 09/17/2016 16:57   Dg Hip Unilat  With Pelvis 2-3 Views Right  Result Date: 09/17/2016 CLINICAL DATA:  Fall with pain to the hip area EXAM: DG HIP (WITH OR WITHOUT PELVIS) 2-3V RIGHT COMPARISON:  None. FINDINGS: SI joints are symmetric. No fracture or malalignment. Pubic symphysis appears intact. Radiopaque prostate seeds. Mild arthritis of the hips. IMPRESSION: No acute osseous abnormality  Electronically Signed   By: Donavan Foil M.D.   On: 09/17/2016 16:41    Procedures Procedures (including critical care time)  Medications Ordered in ED Medications - No data to display   Initial Impression / Assessment and Plan / ED Course  I have reviewed the triage vital signs and the nursing notes.  Pertinent labs & imaging results that were available during my care of the patient were reviewed by me and considered in my medical decision making (see chart for details).     Patient is an elderly male with dementia presenting with unwitnessed fall. Discussed with patient's daughter about how large workup that we wanted to do with patient. He does not do well in new places. We will get screening labs, urine, CT head neck and x-ray of his pelvis. Patient does have a history of hyponatremia, anemia.  Patient labs are not signficant enough to admit to the hosptial.  Patient ambulatory, will have him follow up with PCP for chronic anemia and slowly worsening renal  function.   Final Clinical Impressions(s) / ED Diagnoses   Final diagnoses:  None    New Prescriptions New Prescriptions   No medications on file     Macarthur Critchley, MD 09/17/16 1945

## 2016-09-17 NOTE — Discharge Instructions (Signed)
Please follow-up with your primary care physician about your mild anemia and your kidney infection.

## 2016-09-17 NOTE — ED Notes (Signed)
Bed: [facility] Expected date:  Expected time:  Means of arrival:  Comments: EMS-right hip pain

## 2016-09-17 NOTE — ED Notes (Signed)
Pt in radiology 

## 2016-09-17 NOTE — ED Triage Notes (Addendum)
Patient is from [facility] and transported via [facility] EMS. Pt had an unwitnessed fall today. Patient has dementia but staff reported to EMS that is to his base line. Pt was complaining of right hip pain to EMS and has a pelvic binder in place from sheets. Pt complained of neck discomfort to [facility] staff. Patient is alert, oriented to person but disoriented to situation, place, and time. Also, EMS reported he is not taking any blood thinners and probably didn't hit his head.

## 2016-12-02 ENCOUNTER — Emergency Department (HOSPITAL_COMMUNITY)
Admission: EM | Admit: 2016-12-02 | Discharge: 2016-12-03 | Disposition: A | Discharge status: Home / Self Care | Payer: Medicare Other | Attending: Emergency Medicine | Admitting: Emergency Medicine

## 2016-12-02 DIAGNOSIS — Z8659 Personal history of other mental and behavioral disorders: Secondary | ICD-10-CM

## 2016-12-02 DIAGNOSIS — Y939 Activity, unspecified: Secondary | ICD-10-CM | POA: Diagnosis not present

## 2016-12-02 DIAGNOSIS — Z8546 Personal history of malignant neoplasm of prostate: Secondary | ICD-10-CM | POA: Insufficient documentation

## 2016-12-02 DIAGNOSIS — Y92122 Bedroom in nursing home as the place of occurrence of the external cause: Secondary | ICD-10-CM | POA: Insufficient documentation

## 2016-12-02 DIAGNOSIS — W19XXXA Unspecified fall, initial encounter: Secondary | ICD-10-CM

## 2016-12-02 DIAGNOSIS — W06XXXA Fall from bed, initial encounter: Secondary | ICD-10-CM | POA: Diagnosis not present

## 2016-12-02 DIAGNOSIS — F0390 Unspecified dementia without behavioral disturbance: Secondary | ICD-10-CM | POA: Insufficient documentation

## 2016-12-02 DIAGNOSIS — Y999 Unspecified external cause status: Secondary | ICD-10-CM | POA: Insufficient documentation

## 2016-12-02 DIAGNOSIS — S0081XA Abrasion of other part of head, initial encounter: Secondary | ICD-10-CM | POA: Diagnosis present

## 2016-12-02 DIAGNOSIS — I10 Essential (primary) hypertension: Secondary | ICD-10-CM | POA: Diagnosis not present

## 2016-12-02 NOTE — ED Triage Notes (Signed)
Pt arrives to the ED from [facility] with a hx of dementia after a fall from rolling out of the bed. Witnessed by wife who also has hx of dementia. Patient has small skin tear anterior to right ear. Bleeding controlled. Negative for blood thinners. Patient placed in c-collar and complaining of upper back pain

## 2016-12-02 NOTE — ED Notes (Signed)
Bed: [facility] Expected date:  Expected time:  Means of arrival:  Comments: 82 yr old fall, skin tear to ear

## 2016-12-03 ENCOUNTER — Encounter (HOSPITAL_COMMUNITY): Payer: Self-pay | Admitting: Emergency Medicine

## 2016-12-03 NOTE — ED Provider Notes (Signed)
[place] DEPT Provider Note   CSN: [id number] Arrival date & time: 12/02/16  2357     History   Chief Complaint Chief Complaint  Patient presents with  . Fall  . Back pain    upper    HPI [facility] Clayton Ballard is a 82 y.o. male.  Patient with history of dementia here from the [place] living for evaluation after he rolled out of bed onto the floor, witnessed by his wife. The patient cannot provide reliable history. Per EMS, he has complaint of back pain and has a facial injury. The patient complains only of low back pain. Also per EMS, the patient is at his baseline mental status. No reported vomiting.   The history is provided by the patient and the EMS personnel. No language interpreter was used.  Fall     Past Medical History:  Diagnosis Date  . Anemia   . Dementia   . Depression   . GERD (gastroesophageal reflux disease)   . Hypertension   . Hyponatremia   . Hypothyroidism   . Insomnia   . Prostate cancer ([place])     There are no active problems to display for this patient.   Past Surgical History:  Procedure Laterality Date  . SEED IMPLANTS     PROSTATE CANCER       Home Medications    Prior to Admission medications   Medication Sig Start Date End Date Taking? Authorizing Provider  levothyroxine (SYNTHROID, LEVOTHROID) 75 MCG tablet Take 75 mcg by mouth daily before breakfast.   Yes [provider]  mirtazapine (REMERON) 15 MG tablet Take 15 mg by mouth at bedtime.   Yes [provider]  ondansetron (ZOFRAN) 4 MG tablet Take 4 mg by mouth every 8 (eight) hours as needed for vomiting.  09/29/14  Yes [provider]  pantoprazole (PROTONIX) 40 MG tablet Take 40 mg by mouth daily.  05/16/13  Yes [provider]  clonazePAM (KLONOPIN) 0.5 MG tablet Take 1.5 tablets (0.75 mg total) by mouth at bedtime as needed. Patient not taking: Reported on 12/03/2016 11/04/12   Dohmeier, Asencion Partridge, MD     Family History History reviewed. No pertinent family history.  Social History Social History  Substance Use Topics  . Smoking status: Never Smoker  . Smokeless tobacco: Never Used  . Alcohol use No     Allergies   Iodinated diagnostic agents and Trazodone and nefazodone   Review of Systems Review of Systems  Unable to perform ROS: Dementia     Physical Exam Updated Vital Signs BP (!) 149/63 (BP Location: Left Arm)   Pulse 79   Temp (!) 97.3 F (36.3 C) (Oral)   Resp 15   SpO2 100%   Physical Exam  Constitutional: He appears well-developed and well-nourished.  HENT:  Head: Normocephalic.  No hemotympanum.   Neck: Normal range of motion. Neck supple.  Cardiovascular: Normal rate and regular rhythm.   Pulmonary/Chest: Effort normal and breath sounds normal. He has no wheezes. He has no rales. He exhibits no tenderness.  Abdominal: Soft. Bowel sounds are normal. There is no tenderness. There is no rebound and no guarding.  Musculoskeletal: Normal range of motion. He exhibits no edema.  No midline or paraspinal tenderness of entire spine. Back is atraumatic in appearance. Moves all extremities on command without weakness. No bony deformities.  Neurological: He is alert. He exhibits normal muscle tone. Coordination normal.  Skin: Skin is warm and dry. No rash  noted.  Small abrasion to right preauricular area. No active bleeding. No hematoma.  Psychiatric: He has a normal mood and affect.     ED Treatments / Results  Labs (all labs ordered are listed, but only abnormal results are displayed) Labs Reviewed - No data to display  EKG  EKG Interpretation None       Radiology No results found.  Procedures Procedures (including critical care time)  Medications Ordered in ED Medications - No data to display   Initial Impression / Assessment and Plan / ED Course  I have reviewed the triage vital signs and the nursing notes.  Pertinent labs & imaging  results that were available during my care of the patient were reviewed by me and considered in my medical decision making (see chart for details).     Patient is here for evaluation after rolling out of bed. He has a small abrasion to right facial area that is benign. No other physical exam abnormalities.   He is examined by Dr. Tomi Bamberger and felt stable for discharge home.   Final Clinical Impressions(s) / ED Diagnoses   Final diagnoses:  None   1. Fall 2. Facial abrasion 3. History of dementia  New Prescriptions New Prescriptions   No medications on file     Charlann Lange, [name] 12/03/16 [place], [place], MD 12/03/16 [phone]

## 2016-12-03 NOTE — ED Provider Notes (Signed)
Patient has a history of dementia and fell out of bed. When I see patient he's not having any complaints of pain. He is moving all his extremities well. He has a very small superficial skin tear just anterior to his right ear.  Medical screening examination/treatment/procedure(s) were conducted as a shared visit with non-physician practitioner(s) and myself.  I personally evaluated the patient during the encounter.   EKG Interpretation None       Rolland Porter, MD, Barbette Or, MD 12/03/16 [phone]

## 2016-12-03 NOTE — Discharge Instructions (Signed)
You can be discharged back to your residence. No finding of acute injury. Recommend appropriate wound care of superficial facial wound.

## 2016-12-03 NOTE — ED Notes (Signed)
[facility] called for transport.  

## 2017-02-14 ENCOUNTER — Emergency Department (HOSPITAL_COMMUNITY): Payer: Medicare Other

## 2017-02-14 ENCOUNTER — Encounter (HOSPITAL_COMMUNITY): Payer: Self-pay | Admitting: Emergency Medicine

## 2017-02-14 ENCOUNTER — Emergency Department (HOSPITAL_COMMUNITY)
Admission: EM | Admit: 2017-02-14 | Discharge: 2017-02-15 | Disposition: A | Discharge status: Home / Self Care | Payer: Medicare Other | Attending: Emergency Medicine | Admitting: Emergency Medicine

## 2017-02-14 DIAGNOSIS — Z8546 Personal history of malignant neoplasm of prostate: Secondary | ICD-10-CM | POA: Diagnosis not present

## 2017-02-14 DIAGNOSIS — Y999 Unspecified external cause status: Secondary | ICD-10-CM | POA: Insufficient documentation

## 2017-02-14 DIAGNOSIS — R51 Headache: Secondary | ICD-10-CM | POA: Insufficient documentation

## 2017-02-14 DIAGNOSIS — E039 Hypothyroidism, unspecified: Secondary | ICD-10-CM | POA: Diagnosis not present

## 2017-02-14 DIAGNOSIS — Y92129 Unspecified place in nursing home as the place of occurrence of the external cause: Secondary | ICD-10-CM | POA: Diagnosis not present

## 2017-02-14 DIAGNOSIS — Y939 Activity, unspecified: Secondary | ICD-10-CM | POA: Insufficient documentation

## 2017-02-14 DIAGNOSIS — W19XXXA Unspecified fall, initial encounter: Secondary | ICD-10-CM

## 2017-02-14 DIAGNOSIS — Z79899 Other long term (current) drug therapy: Secondary | ICD-10-CM | POA: Insufficient documentation

## 2017-02-14 DIAGNOSIS — M25552 Pain in left hip: Secondary | ICD-10-CM | POA: Insufficient documentation

## 2017-02-14 DIAGNOSIS — F0390 Unspecified dementia without behavioral disturbance: Secondary | ICD-10-CM | POA: Insufficient documentation

## 2017-02-14 DIAGNOSIS — I10 Essential (primary) hypertension: Secondary | ICD-10-CM | POA: Diagnosis not present

## 2017-02-14 DIAGNOSIS — W010XXA Fall on same level from slipping, tripping and stumbling without subsequent striking against object, initial encounter: Secondary | ICD-10-CM | POA: Insufficient documentation

## 2017-02-14 LAB — BASIC METABOLIC PANEL
Anion gap: 10 (ref 5–15)
BUN: 37 mg/dL — AB (ref 6–20)
CALCIUM: 8.5 mg/dL — AB (ref 8.9–10.3)
CO2: 24 mmol/L (ref 22–32)
CREATININE: 1.86 mg/dL — AB (ref 0.61–1.24)
Chloride: 102 mmol/L (ref 101–111)
GFR calc Af Amer: 38 mL/min — ABNORMAL LOW (ref >60)
GFR, EST NON AFRICAN AMERICAN: 33 mL/min — AB (ref >60)
GLUCOSE: 105 mg/dL — AB (ref 65–99)
Potassium: 4.4 mmol/L (ref 3.5–5.1)
SODIUM: 136 mmol/L (ref 135–145)

## 2017-02-14 LAB — CBC
HCT: 29.1 % — ABNORMAL LOW (ref 39.0–52.0)
Hemoglobin: 9.6 g/dL — ABNORMAL LOW (ref 13.0–17.0)
MCH: 30.9 pg (ref 26.0–34.0)
MCHC: 33 g/dL (ref 30.0–36.0)
MCV: 93.6 fL (ref 78.0–100.0)
PLATELETS: 255 10*3/uL (ref 150–400)
RBC: 3.11 MIL/uL — AB (ref 4.22–5.81)
RDW: 12.3 % (ref 11.5–15.5)
WBC: 10.3 10*3/uL (ref 4.0–10.5)

## 2017-02-14 NOTE — Discharge Instructions (Signed)
Use Tylenol as needed for headache or pain.  Use ice packs if he is complaining of pain. Return to the emergency room if he is acting abnormal, starts vomiting, or has any new, worsening, or concerning symptoms

## 2017-02-14 NOTE — ED Provider Notes (Signed)
[facility] DEPT Provider Note   CSN: [id number] Arrival date & time: 02/14/17  1933     History   Chief Complaint Chief Complaint  Patient presents with  . Fall    HPI [facility] Clayton Ballard is a 82 y.o. male presenting for evaluation after a fall.   Level 5 caveat, as patient has dementia.  Per triage note, patient had a witnessed fall, and EMS was called.  He lost his balance and fell onto a carpeted floor.  He hit the back of his head.  He denies any complaints to EMS.  There is no loss of consciousness.  He is not on blood thinners.  No obvious laceration or deformity was found on scene or by EMS.  Upon talking to the patient, patient states that his left hip is hurting.  He denies other injury or pain at this time.   HPI  Past Medical History:  Diagnosis Date  . Anemia   . Dementia   . Depression   . GERD (gastroesophageal reflux disease)   . Hypertension   . Hyponatremia   . Hypothyroidism   . Insomnia   . Prostate cancer ([place])     There are no active problems to display for this patient.   Past Surgical History:  Procedure Laterality Date  . SEED IMPLANTS     PROSTATE CANCER       Home Medications    Prior to Admission medications   Medication Sig Start Date End Date Taking? Authorizing Provider  acetaminophen (TYLENOL) 325 MG tablet Take 650 mg by mouth every 6 (six) hours as needed for moderate pain.   Yes [provider]  guaifenesin (SILTUSSIN SA) 100 MG/5ML syrup Take 100 mg by mouth 4 (four) times daily as needed for cough.   Yes [provider]  levothyroxine (SYNTHROID, LEVOTHROID) 75 MCG tablet Take 75 mcg by mouth daily before breakfast.   Yes [provider]  loperamide (IMODIUM A-D) 2 MG tablet Take 2 mg by mouth 3 (three) times daily as needed for diarrhea or loose stools.   Yes [provider]  mirtazapine (REMERON) 15 MG tablet Take 7.5 mg by mouth at bedtime.    Yes  [provider]  ondansetron (ZOFRAN) 4 MG tablet Take 4 mg by mouth every 8 (eight) hours as needed for vomiting.  09/29/14  Yes [provider]  pantoprazole (PROTONIX) 40 MG tablet Take 40 mg by mouth daily.  05/16/13  Yes [provider]  ranitidine (ZANTAC) 150 MG tablet Take 150 mg by mouth at bedtime.   Yes [provider]  clonazePAM (KLONOPIN) 0.5 MG tablet Take 1.5 tablets (0.75 mg total) by mouth at bedtime as needed. Patient not taking: Reported on 12/03/2016 11/04/12   Dohmeier, Asencion Partridge, MD    Family History History reviewed. No pertinent family history.  Social History Social History  Substance Use Topics  . Smoking status: Never Smoker  . Smokeless tobacco: Never Used  . Alcohol use No     Allergies   Iodinated diagnostic agents and Trazodone and nefazodone   Review of Systems Review of Systems  Unable to perform ROS: Dementia     Physical Exam Updated Vital Signs BP (!) 146/66 (BP Location: Left Arm)   Pulse 72   Temp 98.8 F (37.1 C) (Oral)   Resp 20   SpO2 97%   Physical Exam  Constitutional: He appears well-developed and well-nourished. No distress.  HENT:  Head: Normocephalic and atraumatic.  No tenderness to palpation of the scalp.  No obvious laceration or hematoma.  Eyes: Pupils are equal, round, and reactive to light. EOM are normal.  Neck: Normal range of motion.  Patient in c-collar.  No tenderness to palpation of midline cervical spine  Cardiovascular: Normal rate, regular rhythm and intact distal pulses.   Pulmonary/Chest: Effort normal and breath sounds normal. No respiratory distress. He has no wheezes. He has no rales. He exhibits no tenderness.  Abdominal: Soft. He exhibits no distension. There is no tenderness.  Musculoskeletal: Normal range of motion. He exhibits tenderness.  Minimal tenderness palpation of left hip.  No tenderness to palpation elsewhere.  Moves all extremities on command.  Strength of  upper and lower extremities intact bilaterally.  Sensation intact bilaterally.  No obvious deformity or laceration noted.  Neurological: He is alert. He has normal strength. No cranial nerve deficit or sensory deficit. GCS eye subscore is 4. GCS verbal subscore is 5. GCS motor subscore is 6.  Skin: Skin is warm. No rash noted.  Psychiatric: He has a normal mood and affect.  Nursing note and vitals reviewed.    ED Treatments / Results  Labs (all labs ordered are listed, but only abnormal results are displayed) Labs Reviewed  CBC - Abnormal; Notable for the following:       Result Value   RBC 3.11 (*)    Hemoglobin 9.6 (*)    HCT 29.1 (*)    All other components within normal limits  BASIC METABOLIC PANEL - Abnormal; Notable for the following:    Glucose, Bld 105 (*)    BUN 37 (*)    Creatinine, Ser 1.86 (*)    Calcium 8.5 (*)    GFR calc non Af Amer 33 (*)    GFR calc Af Amer 38 (*)    All other components within normal limits    EKG  EKG Interpretation None       Radiology Ct Head Wo Contrast  Result Date: 02/14/2017 CLINICAL DATA:  Frequent falls. Trip and fall on carpet, hit back of head. History of prostate cancer, hypertension and dementia. EXAM: CT HEAD WITHOUT CONTRAST CT CERVICAL SPINE WITHOUT CONTRAST TECHNIQUE: Multidetector CT imaging of the head and cervical spine was performed following the standard protocol without intravenous contrast. Multiplanar CT image reconstructions of the cervical spine were also generated. COMPARISON:  CT HEAD and cervical spine Sep 17, 2016 FINDINGS: CT HEAD FINDINGS BRAIN: No intraparenchymal hemorrhage, mass effect nor midline shift. Stable severe ventriculomegaly with disproportionate mesial temporal lobe atrophy. No hydrocephalus. Patchy to confluent supratentorial white matter hypodensities. No acute large vascular territory infarcts. No abnormal extra-axial fluid collections. VASCULAR: Mild calcific atherosclerosis of the carotid  siphons. SKULL: No skull fracture. No significant scalp soft tissue swelling. SINUSES/ORBITS: The mastoid air-cells and included paranasal sinuses are well-aerated.The included ocular globes and orbital contents are non-suspicious. OTHER: None. CT CERVICAL SPINE FINDINGS ALIGNMENT: Maintained lordosis. Stable grade 1 C7-T1 anterolisthesis. SKULL BASE AND VERTEBRAE: Cervical vertebral bodies and posterior elements are intact. Severe C5-6 and C6-7 disc height loss with endplate spurring compatible with degenerative discs. Multilevel severe facet arthropathy. C1-2 articulation maintained. Osteopenia. No destructive bony lesions. Large T3 superior endplate Schmorl's node. SOFT TISSUES AND SPINAL CANAL: Nonacute. DISC LEVELS: No osseous canal stenosis. Moderate LEFT C4-5, moderate to severe bilateral C5-6 and RIGHT C6-7 neural foraminal narrowing. UPPER CHEST: RIGHT apical nodular scarring. LEFT lung apical calcified pleural plaque. OTHER: None. IMPRESSION: CT HEAD: 1. No acute intracranial process.  2. Severe atrophy and moderate chronic small vessel ischemic disease. CT CERVICAL SPINE: 1. No acute fracture. Stable grade 1 C7-T1 anterolisthesis on degenerative basis. Electronically Signed   By: Elon Alas M.D.   On: 02/14/2017 20:34   Ct Cervical Spine Wo Contrast  Result Date: 02/14/2017 CLINICAL DATA:  Frequent falls. Trip and fall on carpet, hit back of head. History of prostate cancer, hypertension and dementia. EXAM: CT HEAD WITHOUT CONTRAST CT CERVICAL SPINE WITHOUT CONTRAST TECHNIQUE: Multidetector CT imaging of the head and cervical spine was performed following the standard protocol without intravenous contrast. Multiplanar CT image reconstructions of the cervical spine were also generated. COMPARISON:  CT HEAD and cervical spine Sep 17, 2016 FINDINGS: CT HEAD FINDINGS BRAIN: No intraparenchymal hemorrhage, mass effect nor midline shift. Stable severe ventriculomegaly with disproportionate mesial  temporal lobe atrophy. No hydrocephalus. Patchy to confluent supratentorial white matter hypodensities. No acute large vascular territory infarcts. No abnormal extra-axial fluid collections. VASCULAR: Mild calcific atherosclerosis of the carotid siphons. SKULL: No skull fracture. No significant scalp soft tissue swelling. SINUSES/ORBITS: The mastoid air-cells and included paranasal sinuses are well-aerated.The included ocular globes and orbital contents are non-suspicious. OTHER: None. CT CERVICAL SPINE FINDINGS ALIGNMENT: Maintained lordosis. Stable grade 1 C7-T1 anterolisthesis. SKULL BASE AND VERTEBRAE: Cervical vertebral bodies and posterior elements are intact. Severe C5-6 and C6-7 disc height loss with endplate spurring compatible with degenerative discs. Multilevel severe facet arthropathy. C1-2 articulation maintained. Osteopenia. No destructive bony lesions. Large T3 superior endplate Schmorl's node. SOFT TISSUES AND SPINAL CANAL: Nonacute. DISC LEVELS: No osseous canal stenosis. Moderate LEFT C4-5, moderate to severe bilateral C5-6 and RIGHT C6-7 neural foraminal narrowing. UPPER CHEST: RIGHT apical nodular scarring. LEFT lung apical calcified pleural plaque. OTHER: None. IMPRESSION: CT HEAD: 1. No acute intracranial process. 2. Severe atrophy and moderate chronic small vessel ischemic disease. CT CERVICAL SPINE: 1. No acute fracture. Stable grade 1 C7-T1 anterolisthesis on degenerative basis. Electronically Signed   By: Elon Alas M.D.   On: 02/14/2017 20:34   Dg Hip Unilat W Or Wo Pelvis 2-3 Views Left  Result Date: 02/14/2017 CLINICAL DATA:  Left hip pain after a fall.  Prostate cancer. EXAM: DG HIP (WITH OR WITHOUT PELVIS) 2-3V LEFT COMPARISON:  None. FINDINGS: Radiation seeds in the prostate. Femoral heads are located. No focal osseous lesion. No acute fracture. IMPRESSION: No acute osseous abnormality. Electronically Signed   By: Abigail Miyamoto M.D.   On: 02/14/2017 20:43     Procedures Procedures (including critical care time)  Medications Ordered in ED Medications - No data to display   Initial Impression / Assessment and Plan / ED Course  I have reviewed the triage vital signs and the nursing notes.  Pertinent labs & imaging results that were available during my care of the patient were reviewed by me and considered in my medical decision making (see chart for details).     Patient presenting for evaluation after witnessed fall.  Staff on scene state he hit his head, and patient complaining of left hip pain.  Will obtain CT head and neck and x-ray of hip.  Patient with a history of hyponatremia, will order CBC and BMP to ensure this was not the cause of the fall.  No obvious deformities or neurologic deficits found on exam.  Imaging negative for acute abnormality.  Labs reassuring.  Case discussed with attending, Dr. Sherry Ruffing evaluated the patient.  At this time, patient appears safe for discharge.  Return precautions given.   Final  Clinical Impressions(s) / ED Diagnoses   Final diagnoses:  Fall, initial encounter    New Prescriptions New Prescriptions   No medications on file     Franchot Heidelberg, PA-C 02/14/17 [id number]    Tegeler, Gwenyth Allegra, MD 02/14/17 [phone]

## 2017-02-14 NOTE — ED Notes (Signed)
Went to draw blood on the patient and patient is in CT. Will re-attempt.

## 2017-02-14 NOTE — ED Triage Notes (Signed)
Pt BIB EMS from [facility] s/p fall. Fall was witnessed, patient known for falls. Patient was standing and tripped, losing his balance and falling onto carpeted floor. Patient did hit back of his head on the carpeted floor. No complaints.

## 2017-11-27 DEATH — deceased

## 2018-03-22 IMAGING — CT CT CERVICAL SPINE W/O CM
4 of 8 series · 12 of 33 positions shown, 13 images · non-contrast
Comparison: CT HEAD and cervical spine September 17, 2016

CLINICAL DATA: Frequent falls. Trip and fall on carpet, hit back of
head. History of prostate cancer, hypertension and dementia.

EXAM:
CT HEAD WITHOUT CONTRAST
CT CERVICAL SPINE WITHOUT CONTRAST
TECHNIQUE: Multidetector CT imaging of the head and cervical spine was
performed following the standard protocol without intravenous
contrast. Multiplanar CT image reconstructions of the cervical spine
were also generated.

[Series 6: sagittal · sagittal · 0.31mm/px · 5 of 74 slices shown]
[im 13/74  bone]
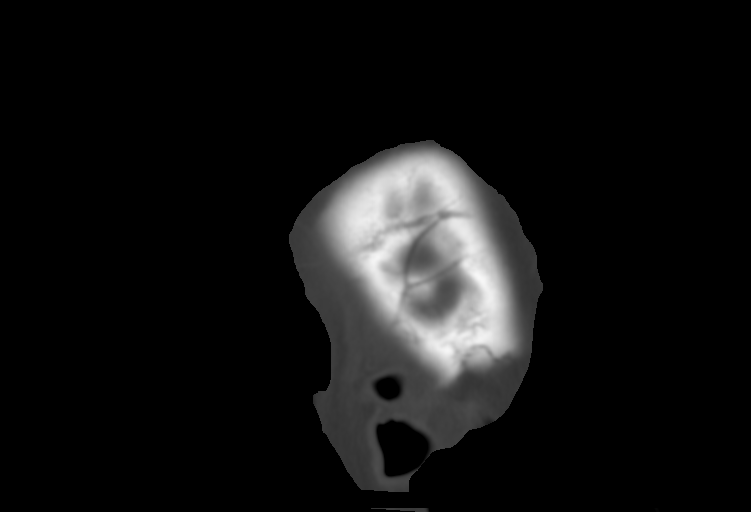
[im 25/74  bone]
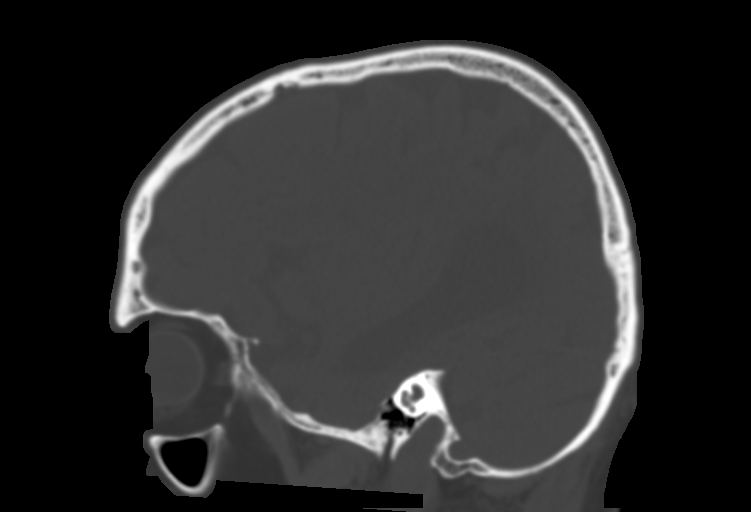
[im 37/74  bone]
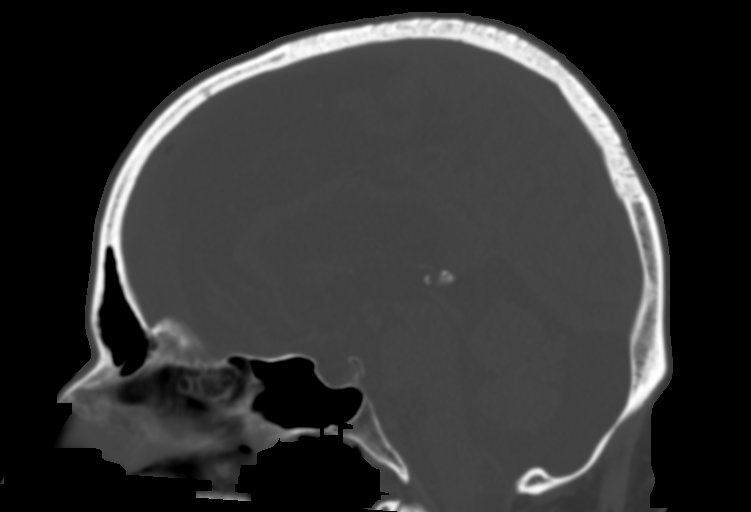
[im 49/74  bone]
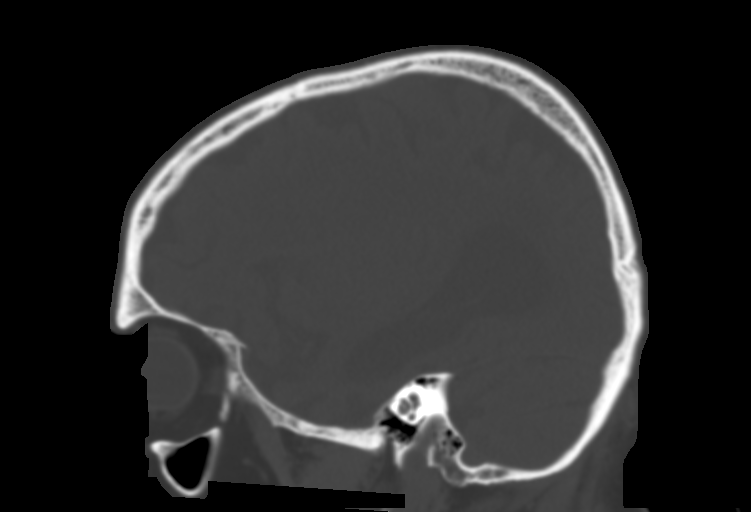
[im 61/74  bone]
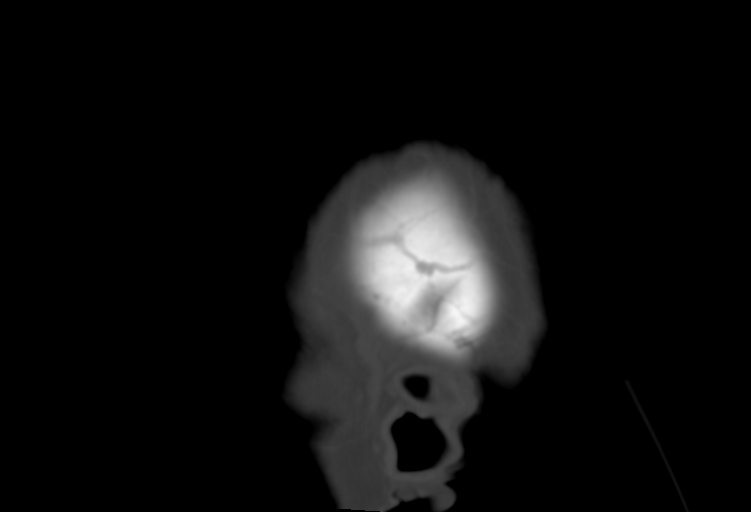

[Series 7: c-spine st · axial · 0.32mm/px · z∈[+74,+154]mm · 3 of 81 slices shown, 4 images]
[im 21/81  soft-tissue]
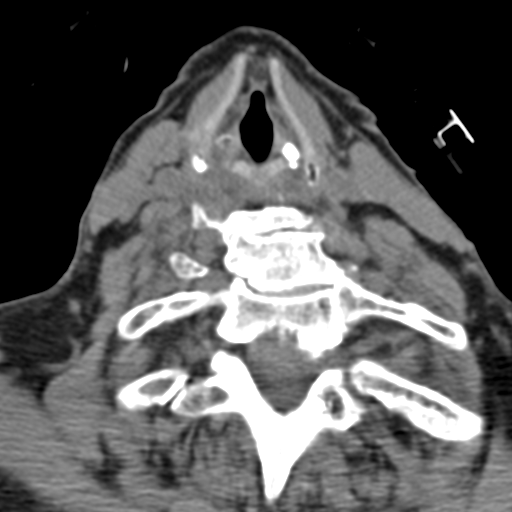
[im 21/81  bone]
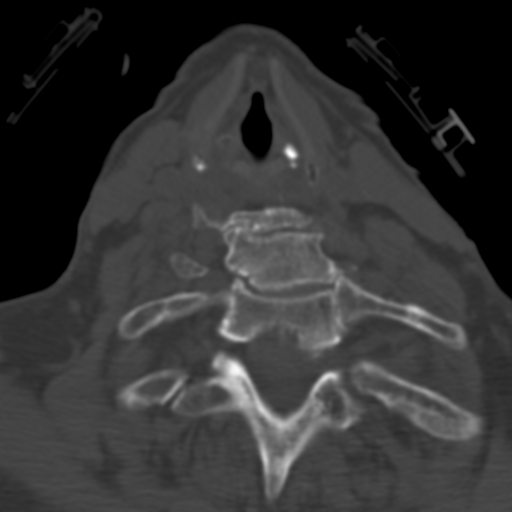
[im 41/81  bone]
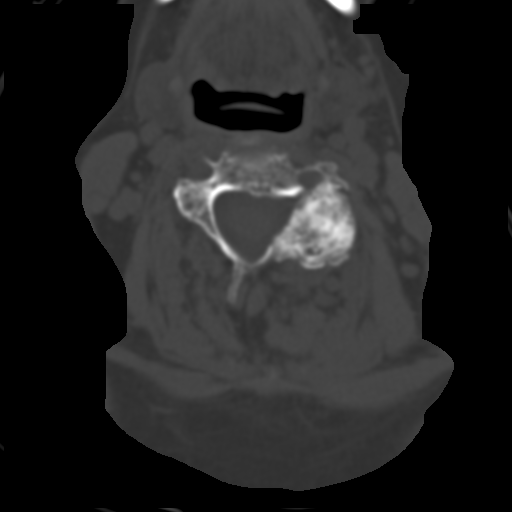
[im 61/81  bone]
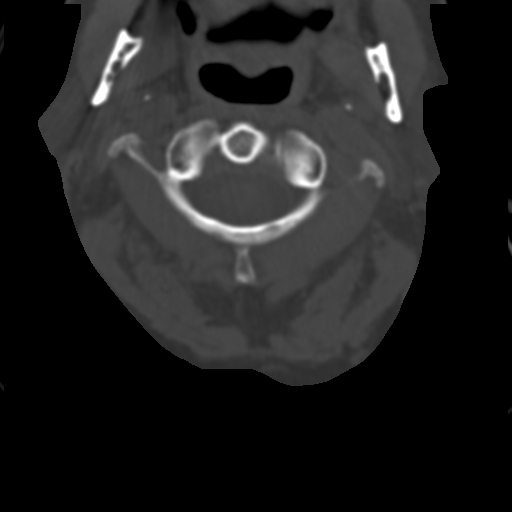

[Series 10: axial recon · axial · 0.23mm/px · z∈[+27,+105]mm · 3 of 93 slices shown]
[im 24/93  bone]
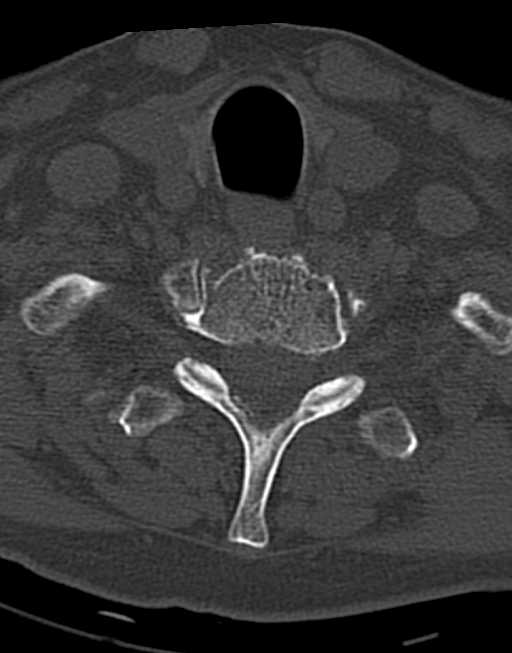
[im 47/93  bone]
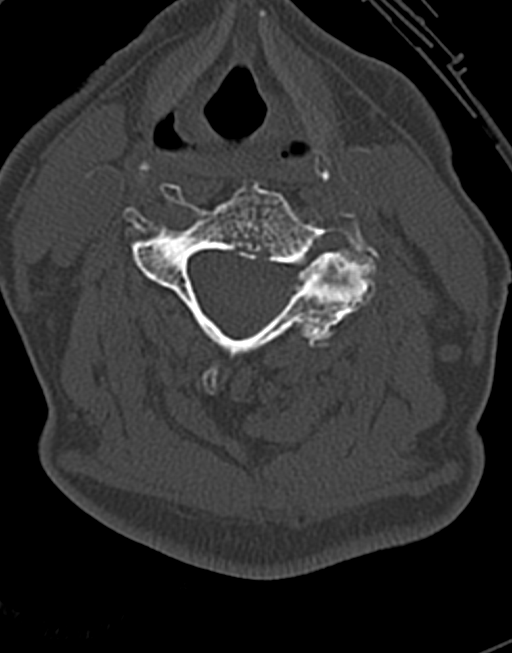
[im 70/93  bone]
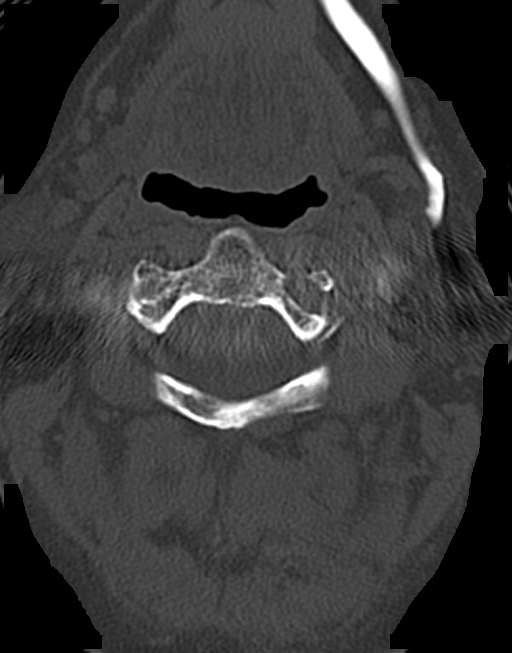

[Series 11: coronal · coronal · 0.23mm/px · 1 of 61 slices shown]
[im 31/61  bone]
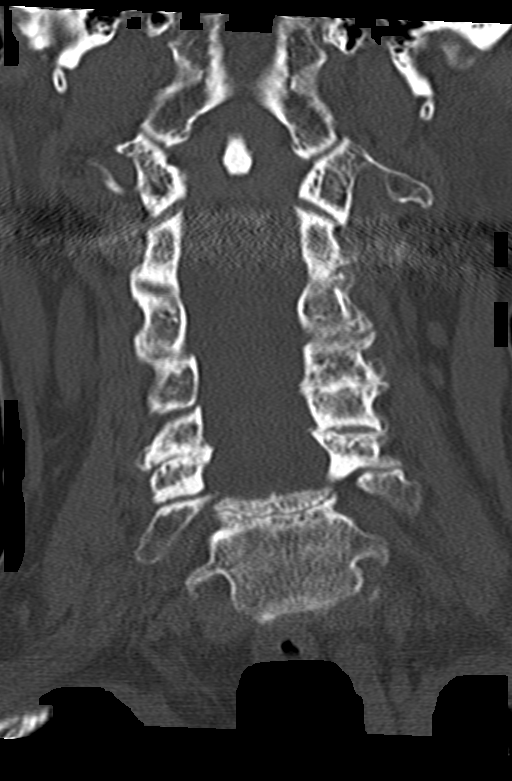

[12 of 33 positions shown; findings below may reference images not displayed]

FINDINGS: CT HEAD FINDINGS

BRAIN: No intraparenchymal hemorrhage, mass effect nor midline
shift. Stable severe ventriculomegaly with disproportionate mesial
temporal lobe atrophy. No hydrocephalus. Patchy to confluent
supratentorial white matter hypodensities. No acute large vascular
territory infarcts. No abnormal extra-axial fluid collections.

VASCULAR: Mild calcific atherosclerosis of the carotid siphons.

SKULL: No skull fracture. No significant scalp soft tissue swelling.

SINUSES/ORBITS: The mastoid air-cells and included paranasal sinuses
are well-aerated.The included ocular globes and orbital contents are
non-suspicious.

OTHER: None.

CT CERVICAL SPINE FINDINGS

ALIGNMENT: Maintained lordosis. Stable grade 1 C7-T1
anterolisthesis.

SKULL BASE AND VERTEBRAE: Cervical vertebral bodies and posterior
elements are intact. Severe C5-6 and C6-7 disc height loss with
endplate spurring compatible with degenerative discs. Multilevel
severe facet arthropathy. C1-2 articulation maintained. Osteopenia.
No destructive bony lesions. Large T3 superior endplate Schmorl's
node.

SOFT TISSUES AND SPINAL CANAL: Nonacute.

DISC LEVELS: No osseous canal stenosis. Moderate LEFT C4-5, moderate
to severe bilateral C5-6 and RIGHT C6-7 neural foraminal narrowing.

UPPER CHEST: RIGHT apical nodular scarring. LEFT lung apical
calcified pleural plaque.

OTHER: None.
IMPRESSION: CT HEAD:

1. No acute intracranial process.
2. Severe atrophy and moderate chronic small vessel ischemic
disease.
CT CERVICAL SPINE:

1. No acute fracture. Stable grade 1 C7-T1 anterolisthesis on
degenerative basis.
